# Patient Record
Sex: Male | Born: 1995 | Race: Black or African American | Hispanic: No | Marital: Single | Smoking: Never smoker
Health system: Southern US, Community
[De-identification: ages and names within clinical notes are randomized; demographics above are authoritative.]

---
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
## 2016-09-19 ENCOUNTER — Encounter (HOSPITAL_COMMUNITY): Payer: Self-pay | Admitting: *Deleted

## 2016-09-19 ENCOUNTER — Emergency Department (HOSPITAL_COMMUNITY)
Admission: EM | Admit: 2016-09-19 | Discharge: 2016-09-19 | Disposition: A | Discharge status: Home / Self Care | Payer: BLUE CROSS/BLUE SHIELD | Attending: Emergency Medicine | Admitting: Emergency Medicine

## 2016-09-19 DIAGNOSIS — Z202 Contact with and (suspected) exposure to infections with a predominantly sexual mode of transmission: Secondary | ICD-10-CM | POA: Insufficient documentation

## 2016-09-19 MED ORDER — CEFTRIAXONE SODIUM 250 MG IJ SOLR
250.0000 mg | Freq: Once | INTRAMUSCULAR | Status: AC
  Administered 2016-09-19: 250 mg via INTRAMUSCULAR
  Filled 2016-09-19: qty 250

## 2016-09-19 MED ORDER — AZITHROMYCIN 250 MG PO TABS
1000.0000 mg | ORAL_TABLET | Freq: Once | ORAL | Status: AC
  Administered 2016-09-19: 1000 mg via ORAL
  Filled 2016-09-19: qty 4

## 2016-09-19 NOTE — ED Provider Notes (Signed)
[facility]-EMERGENCY DEPT Provider Note   CSN: [id number] Arrival date & time: 09/19/16  1821  By signing my name below, I, Rosario AdieWilliam Andrew Hiatt, attest that this documentation has been prepared under the direction and in the presence of [facility] Davier Tramell, [place]NP.  Electronically Signed: Rosario AdieWilliam Andrew Hiatt, ED Scribe. 09/19/16. 7:16 PM.  History   Chief Complaint Chief Complaint  Patient presents with  . Exposure to STD   The history is provided by the patient. No language interpreter was used.  Exposure to STD  This is a new problem. The current episode started more than 1 week ago. The problem occurs constantly. The problem has not changed since onset.Pertinent negatives include no chest pain, no abdominal pain, no headaches and no shortness of breath. Nothing aggravates the symptoms. Nothing relieves the symptoms. He has tried nothing for the symptoms. The treatment provided no relief.   HPI Comments: Dustin Maddox is a 20 y.o. male with no pertinent PMHx, who presents to the Emergency Department for an STD screening s/p being recently exposed to Chlamydia. Pt reports that his significant other, which he has been with for approximately 2 weeks and is currently having unprotected sex with, was recently evaluated and tested positive for Chlamydia. He currently is asymptomatic. He denies dysuria, or any other associated symptoms.    History reviewed. No pertinent past medical history.  There are no active problems to display for this patient.  History reviewed. No pertinent surgical history.  Home Medications    Prior to Admission medications   Not on File    Family History No family history on file.  Social History Social History  Substance Use Topics  . Smoking status: Never Smoker  . Smokeless tobacco: Not on file  . Alcohol use No   Allergies   Patient has no known allergies.  Review of Systems Review of Systems  Respiratory: Negative for shortness of breath.     Cardiovascular: Negative for chest pain.  Gastrointestinal: Negative for abdominal pain.  Genitourinary: Negative for dysuria.  Neurological: Negative for headaches.  All other systems reviewed and are negative.  Physical Exam Updated Vital Signs BP 130/68 (BP Location: Right Arm)   Pulse 65   Temp 98.6 F (37 C) (Oral)   Resp 17   Ht 5\' 7"  (1.702 m)   Wt 61.2 kg   SpO2 100%   BMI 21.14 kg/m   Physical Exam  Constitutional: He appears well-developed and well-nourished.  HENT:  Head: Normocephalic.  Eyes: Conjunctivae are normal.  Cardiovascular: Normal rate.   Pulmonary/Chest: Effort normal. No respiratory distress.  Abdominal: He exhibits no distension. Hernia confirmed negative in the right inguinal area and confirmed negative in the left inguinal area.  Genitourinary: Testes normal. Circumcised. No hypospadias, penile erythema or penile tenderness. No discharge found.  Genitourinary Comments: Chaperone present throughout entire exam. Small right inguinal node that was noted to be enlarged.   Musculoskeletal: Normal range of motion.  Lymphadenopathy: Inguinal adenopathy noted on the right side. No inguinal adenopathy noted on the left side.  Neurological: He is alert.  Skin: Skin is warm and dry.  Psychiatric: He has a normal mood and affect. His behavior is normal.  Nursing note and vitals reviewed.  ED Treatments / Results  DIAGNOSTIC STUDIES: Oxygen Saturation is 100% on RA, normal by my interpretation.   COORDINATION OF CARE: 7:15 PM-Discussed next steps with pt. Pt verbalized understanding and is agreeable with the plan.   Labs (all labs ordered are listed, but  only abnormal results are displayed) Labs Reviewed  GC/CHLAMYD[facility] AMP [facility] - Abnormal; Notable for the following:       Result Value   Chlamydia **POSITIVE** (*)    All other components within normal limits  RPR  HIV ANTIBODY (ROUTINE TESTING)   Radiology No results  found.  Procedures Procedures   Medications Ordered in ED Medications  cefTRIAXone (ROCEPHIN) injection 250 mg (250 mg Intramuscular Given 09/19/16 1943)  azithromycin (ZITHROMAX) tablet 1,000 mg (1,000 mg Oral Given 09/19/16 1943)    Initial Impression / Assessment and Plan / ED Course  I have reviewed the triage vital signs and the nursing notes.  Clinical Course    Patient treated in the ED for STI with Rocephin and Zithromax. Patient advised to inform and treat all sexual partners.  Patient understands that they have GC/Chlamydia cultures pending and will result in 2-3 days. HIV and RPR sent. Pt encouraged to follow up at local health department for future STI checks. No concern for prostatitis or epididymitis. Discussed return precautions. Pt is comfortable with above plan and is stable for discharge at this time. All questions were answered prior to disposition. Strict return precautions for f/u into the ED were discussed.   Final Clinical Impressions(s) / ED Diagnoses   Final diagnoses:  STD exposure   New Prescriptions Discharge Medication List as of 09/19/2016  9:08 PM    I personally performed the services described in this documentation, which was scribed in my presence. The recorded information has been reviewed and is accurate.    [place] M Romel Dumond, NP 09/22/16 1607    Rolland PorterMark James, MD 10/11/16 1022

## 2016-09-19 NOTE — ED Triage Notes (Signed)
Pt reports being told he was exposed to chlamydia. Pt denies symptoms.

## 2016-09-20 LAB — GC/CHLAMYDIA PROBE AMP (~~LOC~~) NOT AT ARMC
CHLAMYDIA, DNA PROBE: POSITIVE — AB
NEISSERIA GONORRHEA: NEGATIVE

## 2016-09-20 LAB — HIV ANTIBODY (ROUTINE TESTING W REFLEX): HIV Screen 4th Generation wRfx: NONREACTIVE

## 2016-09-20 LAB — RPR: RPR Ser Ql: NONREACTIVE

## 2016-09-22 ENCOUNTER — Encounter (HOSPITAL_COMMUNITY): Payer: Self-pay | Admitting: Emergency Medicine
